# Patient Record
Sex: Male | Born: 1950 | Race: White | Hispanic: No | Marital: Single | Smoking: Current every day smoker
Health system: Southern US, Community
[De-identification: ages and names within clinical notes are randomized; demographics above are authoritative.]

---

## 2010-08-21 ENCOUNTER — Emergency Department: Payer: Self-pay | Admitting: Emergency Medicine

## 2011-03-05 ENCOUNTER — Emergency Department: Payer: Self-pay | Admitting: Unknown Physician Specialty

## 2013-10-26 ENCOUNTER — Ambulatory Visit: Payer: Self-pay | Admitting: Internal Medicine

## 2013-10-26 LAB — RAPID INFLUENZA A&B ANTIGENS (ARMC ONLY)

## 2014-10-15 ENCOUNTER — Ambulatory Visit: Payer: Self-pay

## 2016-05-08 IMAGING — CR LEFT THUMB 2+V
3 series · 3 of 3 positions shown · non-contrast
Comparison: None.

CLINICAL DATA: Nail went through left thumb today from nail gun

EXAM:
LEFT THUMB 2+V

[finger ap]
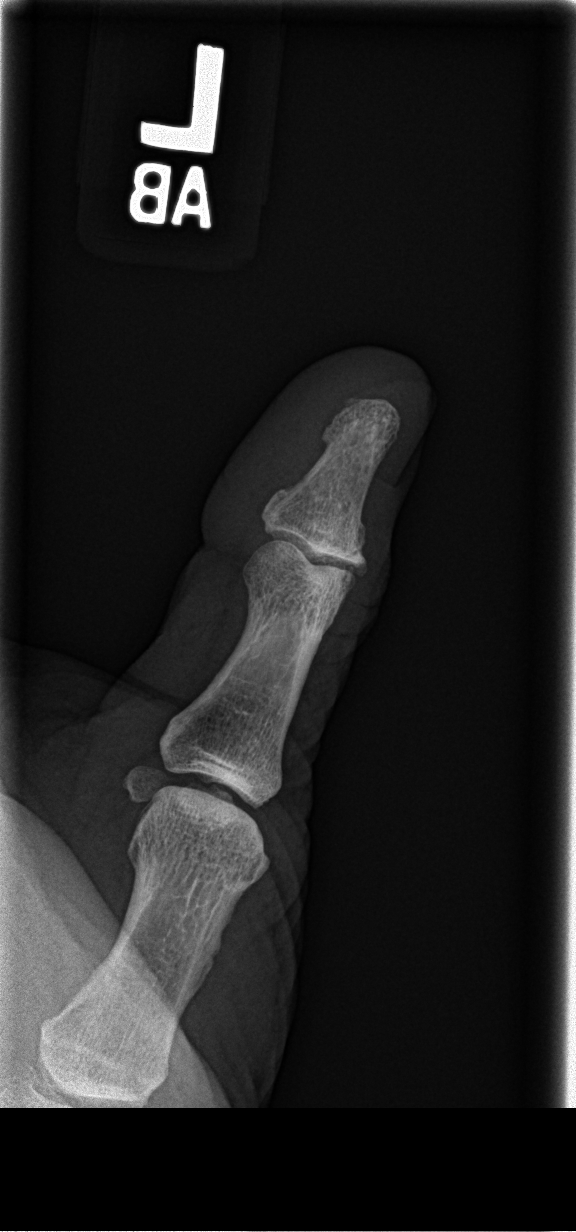

[finger obl]
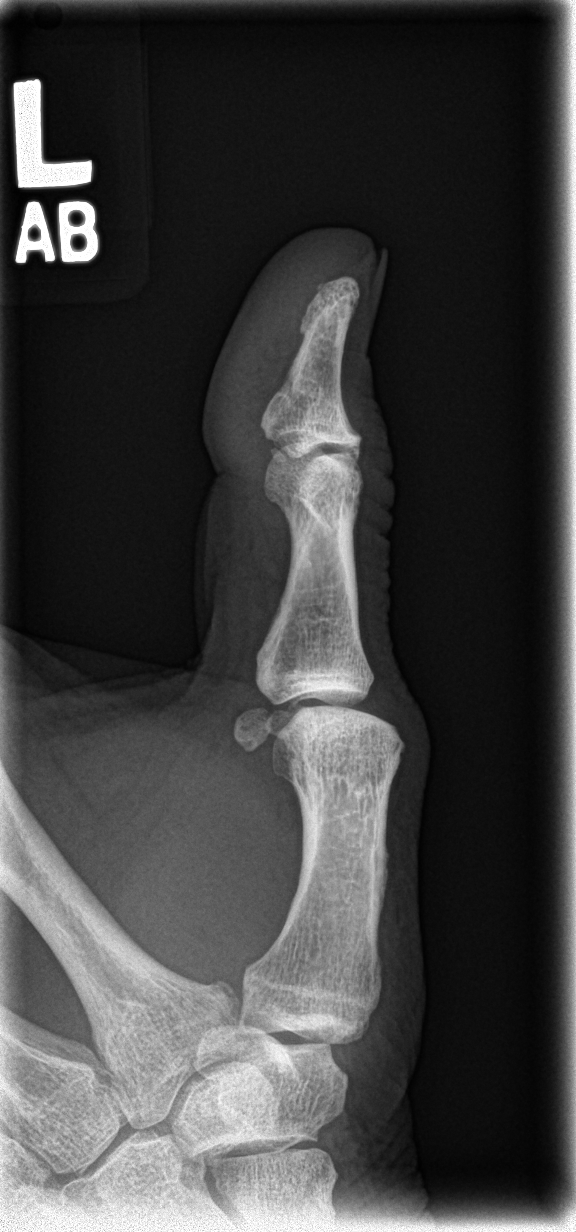

[finger lat]
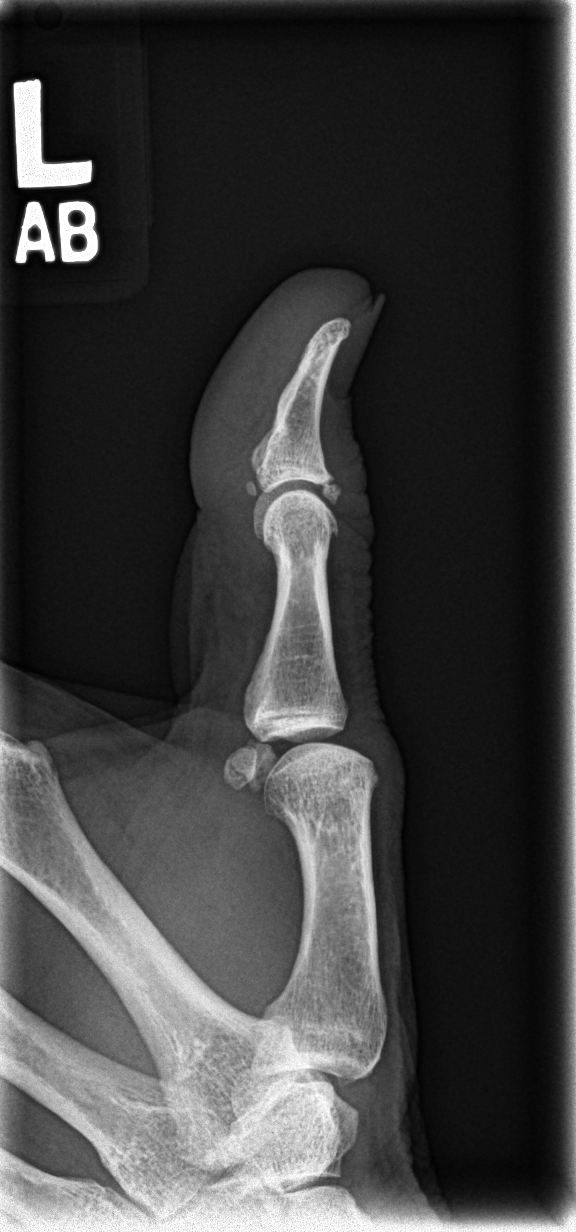

[3 of 3 positions shown; findings below may reference images not displayed]

FINDINGS: No opaque foreign body is seen. No fracture is noted. Mild
degenerative change involves the left first DIP joint.
IMPRESSION: No opaque foreign body.  No fracture.

## 2016-05-11 ENCOUNTER — Encounter (HOSPITAL_COMMUNITY): Payer: Self-pay | Admitting: *Deleted

## 2017-04-05 ENCOUNTER — Encounter: Payer: Self-pay | Admitting: *Deleted

## 2017-04-05 ENCOUNTER — Ambulatory Visit
Admission: EM | Admit: 2017-04-05 | Discharge: 2017-04-05 | Disposition: A | Discharge status: Home / Self Care | Payer: Self-pay | Attending: Emergency Medicine | Admitting: Emergency Medicine

## 2017-04-05 DIAGNOSIS — S0101XA Laceration without foreign body of scalp, initial encounter: Secondary | ICD-10-CM

## 2017-04-05 DIAGNOSIS — R030 Elevated blood-pressure reading, without diagnosis of hypertension: Secondary | ICD-10-CM

## 2017-04-05 DIAGNOSIS — S61011A Laceration without foreign body of right thumb without damage to nail, initial encounter: Secondary | ICD-10-CM

## 2017-04-05 DIAGNOSIS — W25XXXA Contact with sharp glass, initial encounter: Secondary | ICD-10-CM

## 2017-04-05 DIAGNOSIS — S0102XA Laceration with foreign body of scalp, initial encounter: Secondary | ICD-10-CM

## 2017-04-05 NOTE — ED Triage Notes (Signed)
Pt was replacing a large plate glass window this morning and the glass pane fell on him and broke. Now c/o 2-3" laceration to occipital region of scalp and 1/2" laceration to right thumb. Denies loss of consciousness or other injuries. Bleeding controlled.

## 2017-04-05 NOTE — Discharge Instructions (Signed)
Decrease your salt intake. diet and exercise will lower your blood pressure significantly. It is important to keep your blood pressure under good control, as having a elevated for prolonged periods of time significantly increases your risk of stroke, heart attacks, kidney damage, eye damage, and other problems. Measure your blood pressure once a day, preferably at the same time every day. Keep a log of this and bring it to your next doctor's appointment. Return here in 10 days for blood pressure recheck if you're able to find a primary care physician by then. Return immediately to the ER if you start having chest pain, headache, problems seeing, problems talking, problems walking, if you feel like you're about to pass out, if you do pass out, if you have a seizure, or for any other concerns.  Go to www.goodrx.com to look up your medications. This will give you a list of where you can find your prescriptions at the most affordable prices. Or ask the pharmacist what the cash price is, or if they have any other discount programs available to help make your medication more affordable. This can be less expensive than what you would pay with insurance.    Here is a list of primary care providers who are taking new patients:  Dr. Elizabeth Sauereanna Jones, Dr. Schuyler AmorWilliam Plonk [address] [place] [address] [phone]  Dr. Everlene OtherJayce Cook [address]  [place] [address]  [phone]  [facility] [place] [address]  [place] [address]  [phone]  [facility] [address]  [place], [place] [address] [phone]  [facility] [place] [address]  [phone] [place], [place] [address]  Here are clinics/ other resources who will see you if you do not have insurance. Some have certain criteria that you must meet. Call them and find out what they are:  [facility]: [address] [place] [place], [place] [address] Phone: [phone] Hours: First and Third  Saturdays of each Month, 9 a.m. - 1 p.m.  [facility]: [address], [place], [place] [address] Phone: [phone] Hours: Tuesday, 4 p.m. - 8 p.m. Thursday, 1 p.m. - 8 p.m. Wednesday, 9 a.m. - [facility]  [facility] [address], [place], [place] [address] Phone: [phone] Pharmacy Phone Number: [phone] Dental Phone Number: [phone] [facility] Insurance Help: [phone]  Dental Hours: Monday - Thursday, 8 a.m. - 6 p.m.  [facility] Realharles Drew [facility] [address] [place], [place] [address] Phone: [phone] Pharmacy Phone Number: [phone] [facility] Insurance Help: [phone]  [facility] [address] [place], [place] [address] Phone: [phone] Pharmacy Phone Number: [phone] [facility] Insurance Help: [phone]  [facility] [address] [place], [place] [address] Phone: [phone] [facility] Insurance Help: [phone]   [facility] [address] [place], [place] [address] Phone: [phone]

## 2017-04-05 NOTE — ED Notes (Signed)
Pt refused thumb splint.

## 2017-04-05 NOTE — ED Provider Notes (Signed)
HPI  SUBJECTIVE:  Adrian Hubbard is a right handed 66 y.o. male who presents with a laceration to his scalp and dorsal aspect of his right thumb at the PIP. States that he was taking a broken window outor a windowframe, and it fell on him. This occurred 10 minutes prior to arrival. He reports soreness along the scalp laceration. No foreign body sensation. No nausea, vomiting, loss consciousness, amnesia. He applied pressure and came here immediately. No aggravating factors. No numbness, tingling, limitation of motion, no foreign body sensation in his right thumb. He tried applying a Band-Aid. There are no other aggravating or alleviating factors. He has a past medical history of hypertension. has not been on medications in 20 years. States it normally runs 125-130/85-90. No history of diabetes. He is a smoker. PMD: None.    History reviewed. No pertinent past medical history.  History reviewed. No pertinent surgical history.  History reviewed. No pertinent family history.  Social History  Substance Use Topics  . Smoking status: Current Every Day Smoker  . Smokeless tobacco: Never Used  . Alcohol use Yes    No current facility-administered medications for this encounter.   Current Outpatient Prescriptions:  .  aspirin EC 81 MG tablet, Take 81 mg by mouth daily., Disp: , Rfl:   No Known Allergies   ROS  As noted in HPI.   Physical Exam  BP (!) 162/100 (BP Location: Left Arm)   Pulse (!) 103   Temp 98.4 F (36.9 C) (Oral)   Resp 16   Ht 5\' 9"  (1.753 m)   Wt 157 lb (71.2 kg)   SpO2 100%   BMI 23.18 kg/m   repeat BP 160/100 pt asxatic  Constitutional: Well developed, well nourished, no acute distress Eyes:  EOMI, conjunctiva normal bilaterally HENT: 5.5 clean linear vertical laceration on the occiput. Normocephalic, ,mucus membranes moist Respiratory: Normal inspiratory effort Cardiovascular: Normal rate GI: nondistended skin: No rash, skin intact Musculoskeletal:  1.0 cm laceration over the PIP right thumb. Explored with adequate hemostasis. No foreign bodies seen. 2 point discrimination intact. Patient able flex/extend thumb against resistance without any problem. Neurologic: Alert & oriented x 3, no focal neuro deficits Psychiatric: Speech and behavior appropriate   ED Course   Medications - No data to display  No orders of the defined types were placed in this encounter.   No results found for this or any previous visit (from the past 24 hour(s)). No results found.  ED Clinical Impression  Laceration of scalp, initial encounter  Laceration of right thumb without foreign body without damage to nail, initial encounter  Single episode of elevated blood pressure  ED Assessment/Plan  [name]. Unable to find any record of this. Reviewed care everywhere records. No record of recent tetanus. Discussed this with patient however Patient declined tetanus.  [name].  requested Band-Aid, will Steri-Strip, splint.   procedure note: Explored scalp wound with adequate hemostasis, removed 5 shards of glass. Probed multiple times and was unable to locate any further foreign bodies. Anesthetized area with 3 cc of lidocaine 1% with epinephrine with adequate anesthesia. Then irrigated with wound care solution. Placed 7 staples with close approximation. Patient tolerated procedure well.  Return here in 10 days for staple removal, and recheck of wound on the dorsal right thumb sooner for any signs of infection.   Blood pressure noted. Patient declines any  chest pain, shortness of breath, headache, blurred vision, arm or leg weakness, facial droop, chest pain, shortness of breath, pain tearing through to his back. Advised patient that he will need to keep an eye on this and may need to be started back on blood pressure medicine if it stays elevated for  prolonged period of time. He is to measure his blood pressure once a day, everyday preferably at the same time and keep a log of this. He will follow-up with a primary care physician of his choice and will have it re-measured here when he returns for staple removal in 10 days. Providing primary care referral.   Discussed  MDM, plan and followup with patient. Discussed sn/sx that should prompt return to the ED. Patient agrees with plan.   Meds ordered this encounter  Medications  . aspirin EC 81 MG tablet    Sig: Take 81 mg by mouth daily.    *This clinic note was created using Dragon dictation software. Therefore, there may be occasional mistakes despite careful proofreading.  ?   Domenick GongMortenson, Joani Cosma, MD 04/05/17 1231

## 2018-06-05 ENCOUNTER — Other Ambulatory Visit: Payer: Self-pay | Admitting: Urology

## 2018-06-06 LAB — PSA: Prostate Specific Ag, Serum: 2 ng/mL (ref 0.0–4.0)
